# Patient Record
Sex: Female | Born: 1995 | Race: White | Hispanic: No | Marital: Single | Smoking: Current every day smoker
Health system: Southern US, Community
[De-identification: ages and names within clinical notes are randomized; demographics above are authoritative.]

## PROBLEM LIST (undated history)

## (undated) DIAGNOSIS — J45909 Unspecified asthma, uncomplicated: Secondary | ICD-10-CM

## (undated) HISTORY — DX: Unspecified asthma, uncomplicated: J45.909

---

## 2016-03-26 ENCOUNTER — Ambulatory Visit (INDEPENDENT_AMBULATORY_CARE_PROVIDER_SITE_OTHER): Payer: Medicaid Other

## 2016-03-26 ENCOUNTER — Encounter (HOSPITAL_COMMUNITY): Payer: Self-pay | Admitting: *Deleted

## 2016-03-26 ENCOUNTER — Ambulatory Visit (HOSPITAL_COMMUNITY)
Admission: EM | Admit: 2016-03-26 | Discharge: 2016-03-26 | Disposition: A | Discharge status: Home / Self Care | Payer: Medicaid Other | Attending: Family Medicine | Admitting: Family Medicine

## 2016-03-26 DIAGNOSIS — S93401A Sprain of unspecified ligament of right ankle, initial encounter: Secondary | ICD-10-CM

## 2016-03-26 NOTE — ED Provider Notes (Signed)
CSN: [id number]     Arrival date & time 03/26/16  1614 History   First MD Initiated Contact with Patient 03/26/16 1754     Chief Complaint  Patient presents with  . Ankle Injury   (Consider location/radiation/quality/duration/timing/severity/associated sxs/prior Treatment) 20 year old female states she twisted her right ankle 2 days ago and it is not better today even though she has been treating with Rice therapy.      History reviewed. No pertinent past medical history. History reviewed. No pertinent surgical history. History reviewed. No pertinent family history. Social History  Substance Use Topics  . Smoking status: Never Smoker  . Smokeless tobacco: Never Used  . Alcohol use No   OB History    No data available     Review of Systems  Constitutional: Negative for activity change, chills and fever.  HENT: Negative.   Respiratory: Negative.   Cardiovascular: Negative.   Musculoskeletal:       As per HPI  Skin: Negative for color change, pallor and rash.  Neurological: Negative.   All other systems reviewed and are negative.   Allergies  Review of patient's allergies indicates no known allergies.  Home Medications   Prior to Admission medications   Not on File   Meds Ordered and Administered this Visit  Medications - No data to display  BP 122/74   Pulse 71   Temp 98.7 F (37.1 C) (Oral)   Resp 16   LMP 01/24/2016 Comment: abstinate   and  bcp  SpO2 100%  No data found.   Physical Exam  Constitutional: She is oriented to person, place, and time. She appears well-developed and well-nourished. No distress.  HENT:  Head: Normocephalic and atraumatic.  Eyes: EOM are normal. Pupils are equal, round, and reactive to light.  Neck: Normal range of motion. Neck supple.  Musculoskeletal:  Mild swelling over the lateral malleolus, less over the medial malleolus. Tenderness to the soft tissue around the molar lying. Plantarflexion and dorsiflexion limited due  to ankle pain. No obvious deformity. Distal neurovascular and motor sensory is intact. Pedal pulse 2+.  Lymphadenopathy:    She has no cervical adenopathy.  Neurological: She is alert and oriented to person, place, and time. No cranial nerve deficit.  Skin: Skin is warm and dry.    Urgent Care Course   Clinical Course    Procedures (including critical care time)  Labs Review Labs Reviewed - No data to display  Imaging Review Dg Ankle Complete Right  Result Date: 03/26/2016 CLINICAL DATA:  Pain after fall 3 days ago EXAM: RIGHT ANKLE - COMPLETE 3+ VIEW COMPARISON:  None. FINDINGS: There is no evidence of fracture, dislocation, or joint effusion. There is no evidence of arthropathy or other focal bone abnormality. Soft tissues are unremarkable. IMPRESSION: Negative. Electronically Signed   By: Gerome Sam III M.D   On: 03/26/2016 18:43     Visual Acuity Review  Right Eye Distance:   Left Eye Distance:   Bilateral Distance:    Right Eye Near:   Left Eye Near:    Bilateral Near:         MDM   1. Right ankle sprain, initial encounter    Continue to apply ice to the ankle, keep elevated, rest as much as possible and decrease her walking for the next few days and keep it wrapped. It will take several days for your ankle to heal and for the swelling to go down. No running or sports for the next  10-12 days.     Hayden Rasmussenavid Faron Whitelock, NP 03/26/16 1905

## 2016-03-26 NOTE — ED Triage Notes (Signed)
Pt  Reports she  Twisted  Her r  Ankle   sev  Days  Ago  She has  Pain  r  Ankle   With   Pain on  Weight  Bearing     No  Obvious  Deformity

## 2016-03-26 NOTE — Discharge Instructions (Signed)
Continue to apply ice to the ankle, keep elevated, rest as much as possible and decrease her walking for the next few days and keep it wrapped. It will take several days. Her ankle to heal and for the swelling to go down. No running or sports for the next 10-12 days.

## 2018-03-18 ENCOUNTER — Other Ambulatory Visit: Payer: Self-pay

## 2018-03-18 ENCOUNTER — Encounter (HOSPITAL_COMMUNITY): Payer: Self-pay | Admitting: Emergency Medicine

## 2018-03-18 ENCOUNTER — Emergency Department (HOSPITAL_COMMUNITY): Payer: BLUE CROSS/BLUE SHIELD

## 2018-03-18 ENCOUNTER — Emergency Department (HOSPITAL_COMMUNITY)
Admission: EM | Admit: 2018-03-18 | Discharge: 2018-03-19 | Disposition: A | Discharge status: Home / Self Care | Payer: BLUE CROSS/BLUE SHIELD | Attending: Emergency Medicine | Admitting: Emergency Medicine

## 2018-03-18 DIAGNOSIS — S60051A Contusion of right little finger without damage to nail, initial encounter: Secondary | ICD-10-CM

## 2018-03-18 DIAGNOSIS — S80211A Abrasion, right knee, initial encounter: Secondary | ICD-10-CM | POA: Diagnosis not present

## 2018-03-18 DIAGNOSIS — Y929 Unspecified place or not applicable: Secondary | ICD-10-CM | POA: Diagnosis not present

## 2018-03-18 DIAGNOSIS — S6991XA Unspecified injury of right wrist, hand and finger(s), initial encounter: Secondary | ICD-10-CM | POA: Diagnosis present

## 2018-03-18 DIAGNOSIS — Y939 Activity, unspecified: Secondary | ICD-10-CM | POA: Diagnosis not present

## 2018-03-18 DIAGNOSIS — Y999 Unspecified external cause status: Secondary | ICD-10-CM | POA: Diagnosis not present

## 2018-03-18 DIAGNOSIS — T07XXXA Unspecified multiple injuries, initial encounter: Secondary | ICD-10-CM

## 2018-03-18 MED ORDER — IBUPROFEN 400 MG PO TABS
600.0000 mg | ORAL_TABLET | Freq: Once | ORAL | Status: AC
  Administered 2018-03-18: 600 mg via ORAL
  Filled 2018-03-18: qty 1

## 2018-03-18 MED ORDER — TETANUS-DIPHTH-ACELL PERTUSSIS 5-2.5-18.5 LF-MCG/0.5 IM SUSP
0.5000 mL | Freq: Once | INTRAMUSCULAR | Status: AC
  Administered 2018-03-18: 0.5 mL via INTRAMUSCULAR
  Filled 2018-03-18: qty 0.5
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
## 2018-03-19 ENCOUNTER — Emergency Department (HOSPITAL_COMMUNITY): Payer: BLUE CROSS/BLUE SHIELD

## 2018-03-19 LAB — I-STAT BETA HCG BLOOD, ED (MC, WL, AP ONLY)

## 2018-03-19 MED ORDER — METHOCARBAMOL 500 MG PO TABS: 500.0000 mg | ORAL_TABLET | Freq: Four times a day (QID) | ORAL | 0 refills | Status: AC | PRN

## 2018-03-19 MED ORDER — BACITRACIN ZINC 500 UNIT/GM EX OINT
TOPICAL_OINTMENT | Freq: Once | CUTANEOUS | Status: AC
  Administered 2018-03-19: 02:00:00 via TOPICAL

## 2018-03-19 MED ORDER — METHOCARBAMOL 500 MG PO TABS: 500.0000 mg | ORAL_TABLET | Freq: Four times a day (QID) | ORAL | 0 refills | Status: DC | PRN

## 2018-03-19 NOTE — Discharge Instructions (Addendum)
Buddy-tape your pinky finger as needed. If you continue to have problems with the finger, then follow up with the hand specialist.

## 2020-05-17 ENCOUNTER — Ambulatory Visit (HOSPITAL_COMMUNITY)
Admission: EM | Admit: 2020-05-17 | Discharge: 2020-05-17 | Disposition: A | Discharge status: Home / Self Care | Payer: Medicaid Other | Attending: Emergency Medicine | Admitting: Emergency Medicine

## 2020-05-17 ENCOUNTER — Other Ambulatory Visit: Payer: Self-pay

## 2020-05-17 ENCOUNTER — Ambulatory Visit (INDEPENDENT_AMBULATORY_CARE_PROVIDER_SITE_OTHER): Payer: Medicaid Other

## 2020-05-17 ENCOUNTER — Encounter (HOSPITAL_COMMUNITY): Payer: Self-pay

## 2020-05-17 DIAGNOSIS — S62356A Nondisplaced fracture of shaft of fifth metacarpal bone, right hand, initial encounter for closed fracture: Secondary | ICD-10-CM

## 2020-05-17 DIAGNOSIS — S60221A Contusion of right hand, initial encounter: Secondary | ICD-10-CM

## 2020-05-17 DIAGNOSIS — W228XXA Striking against or struck by other objects, initial encounter: Secondary | ICD-10-CM

## 2020-05-17 DIAGNOSIS — M79641 Pain in right hand: Secondary | ICD-10-CM

## 2020-05-17 MED ORDER — IBUPROFEN 800 MG PO TABS
800.0000 mg | ORAL_TABLET | Freq: Three times a day (TID) | ORAL | 0 refills | Status: AC | PRN
Start: 2020-05-17 — End: ?
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
## 2020-05-17 NOTE — ED Notes (Signed)
Splint applied by ortho tech

## 2020-05-17 NOTE — ED Notes (Signed)
Called ortho tech, Kerry Cannon, for ulnar gutter placement. ETA approx 15 minutes after ortho tech goes to ED.
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
## 2020-05-17 NOTE — Discharge Instructions (Signed)
Take the ibuprofen as prescribed.  Rest and elevate your hand.  Apply ice packs 2-3 times a day for up to 20 minutes each.  Wear the splint.    Schedule a follow-up appointment with an orthopedist such as the one listed below as soon as possible.

## 2020-05-17 NOTE — Progress Notes (Signed)
Orthopedic Tech Progress Note Patient Details:  Kerry Cannon [birth year] [id number]  Ortho Devices Type of Ortho Device: Ulna gutter splint Ortho Device/Splint Location: RUE Ortho Device/Splint Interventions: Application   Post Interventions Patient Tolerated: Well Instructions Provided: Care of device   Jeramine Delis E Emmet Messer 05/17/2020, 7:06 PM

## 2020-08-08 ENCOUNTER — Encounter (HOSPITAL_COMMUNITY): Payer: Self-pay

## 2020-08-08 ENCOUNTER — Telehealth (INDEPENDENT_AMBULATORY_CARE_PROVIDER_SITE_OTHER): Payer: No Payment, Other | Admitting: Psychiatry

## 2020-08-08 ENCOUNTER — Other Ambulatory Visit: Payer: Self-pay

## 2020-08-08 DIAGNOSIS — F411 Generalized anxiety disorder: Secondary | ICD-10-CM | POA: Diagnosis not present

## 2020-08-08 DIAGNOSIS — F4312 Post-traumatic stress disorder, chronic: Secondary | ICD-10-CM | POA: Diagnosis not present

## 2020-08-08 DIAGNOSIS — F3132 Bipolar disorder, current episode depressed, moderate: Secondary | ICD-10-CM | POA: Diagnosis not present

## 2020-08-08 MED ORDER — LAMOTRIGINE 25 MG PO TABS: 25.0000 mg | ORAL_TABLET | Freq: Every day | ORAL | 1 refills | Status: DC
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
## 2020-09-02 ENCOUNTER — Telehealth (HOSPITAL_COMMUNITY): Payer: Self-pay

## 2020-09-02 NOTE — Telephone Encounter (Signed)
Patient called and stated that her Lamotrigine is giving her suicidal ideations that started about a week into starting it. She stated that she had never experienced anything like that before. She also stated that she has stopped taking it when the SI started. Please review and advise. Thank you

## 2020-09-05 ENCOUNTER — Other Ambulatory Visit: Payer: Self-pay

## 2020-09-05 ENCOUNTER — Telehealth (INDEPENDENT_AMBULATORY_CARE_PROVIDER_SITE_OTHER): Payer: No Payment, Other | Admitting: Psychiatry

## 2020-09-05 ENCOUNTER — Encounter (HOSPITAL_COMMUNITY): Payer: Self-pay

## 2020-09-05 DIAGNOSIS — F3131 Bipolar disorder, current episode depressed, mild: Secondary | ICD-10-CM | POA: Diagnosis not present

## 2020-09-05 MED ORDER — HYDROXYZINE HCL 10 MG PO TABS: 10.0000 mg | ORAL_TABLET | Freq: Three times a day (TID) | ORAL | 1 refills | Status: DC | PRN

## 2020-09-05 MED ORDER — OXCARBAZEPINE 300 MG PO TABS: 150.0000 mg | ORAL_TABLET | Freq: Two times a day (BID) | ORAL | 1 refills | Status: DC
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
## 2020-09-12 ENCOUNTER — Encounter (HOSPITAL_COMMUNITY): Payer: Self-pay

## 2020-09-12 ENCOUNTER — Other Ambulatory Visit: Payer: Self-pay

## 2020-09-12 ENCOUNTER — Telehealth (INDEPENDENT_AMBULATORY_CARE_PROVIDER_SITE_OTHER): Payer: No Payment, Other | Admitting: Psychiatry

## 2020-09-12 DIAGNOSIS — F3132 Bipolar disorder, current episode depressed, moderate: Secondary | ICD-10-CM

## 2020-09-12 NOTE — Progress Notes (Signed)
Psychiatric Initial Adult Assessment   Patient Identification: Kerry Cannon MRN:  [id number] Date of Evaluation:  09/12/2020 Referral Source: self Chief Complaint:  Depression and anxiety  Visit Diagnosis:  Bipolar affective disorder, depression, mild  History of Present Illness:   25 yo female presents for assistance with depression and anxiety follow up after starting new medications, Tripleptal.  Based on her work schedule, started the Trileptal 3 days ago, no negative side effects.  "Doing fine, anxiety is much better." Changing jobs has helped significantly, excited about her new job at a [employer].  Sleep is "amazing" with hydroxyzine.  Appetite is steady.  No complaints.  Educated her to call if she has any negative reactions like she did with Lamictal, restarted in February and experienced suicidal ideations 8 days later.    Follow-up in one month, sooner if needed advised to stop taking Trileptal if suicidal ideations started.  Associated Signs/Symptoms: Depression Symptoms:  Depression, low (Hypo) Manic Symptoms:  Irritable Mood, Labiality of Mood, in the past, none currently Anxiety Symptoms:  Excessive worries in the past, improved significantly Psychotic Symptoms:  none PTSD Symptoms: Had a traumatic exposure:  complex childhood trauma  Past Psychiatric History: bipolar II, depression, anxiety, PTSD  Previous Psychotropic Medications: Yes   Substance Abuse History in the last 12 months:  No.  Consequences of Substance Abuse: NA  Past Medical History: No past medical history on file. No past surgical history on file.  Family Psychiatric History: bipolar and depression on both sides of the family  Family History: No family history on file.  Social History:   Social History   Socioeconomic History  . Marital status: Single    Spouse name: Not on file  . Number of children: Not on file  . Years of education: Not on file  . Highest education  level: Not on file  Occupational History  . Not on file  Tobacco Use  . Smoking status: Current Every Day Smoker    Types: E-cigarettes  . Smokeless tobacco: Current User  . Tobacco comment: vapes nicotine  Vaping Use  . Vaping Use: Every day  Substance and Sexual Activity  . Alcohol use: No  . Drug use: Never  . Sexual activity: Not on file  Other Topics Concern  . Not on file  Social History Narrative  . Not on file   Social Determinants of Health   Financial Resource Strain: Not on file  Food Insecurity: Not on file  Transportation Needs: Not on file  Physical Activity: Not on file  Stress: Not on file  Social Connections: Not on file    Additional Social History: lives with two roommates and workss  Allergies:  No Known Allergies  Metabolic Disorder Labs: No results found for: HGBA1C, MPG No results found for: PROLACTIN No results found for: CHOL, TRIG, HDL, CHOLHDL, VLDL, LDLCALC No results found for: TSH  Therapeutic Level Labs: No results found for: LITHIUM No results found for: CBMZ No results found for: VALPROATE  Current Medications: Current Outpatient Medications  Medication Sig Dispense Refill  . hydrOXYzine (ATARAX/VISTARIL) 10 MG tablet Take 1 tablet (10 mg total) by mouth 3 (three) times daily as needed for anxiety (may take 2-3 for sleep PRN). 180 tablet 1  . ibuprofen (ADVIL) 800 MG tablet Take 1 tablet (800 mg total) by mouth every 8 (eight) hours as needed. 21 tablet 0  . methocarbamol (ROBAXIN) 500 MG tablet Take 1 tablet (500 mg total) by mouth every 6 (  six) hours as needed for muscle spasms. 40 tablet 0  . Oxcarbazepine (TRILEPTAL) 300 MG tablet Take 0.5 tablets (150 mg total) by mouth 2 (two) times daily. 60 tablet 1   No current facility-administered medications for this visit.    Musculoskeletal: Strength & Muscle Tone: did not witness, telephone interview Gait & Station: did not witness Patient leans: did not witness  Psychiatric  Specialty Exam: Review of Systems  Psychiatric/Behavioral: Positive for dysphoric mood. The patient is nervous/anxious.   All other systems reviewed and are negative.   There were no vitals taken for this visit.There is no height or weight on file to calculate BMI.  General Appearance: UTA, telephone assessment  Eye Contact:  UTS  Speech:  Normal Rate  Volume:  Normal  Mood:  Anxious and Depressed, low levels  Affect:  UTA  Thought Process:  Coherent  Orientation:  Full (Time, Place, and Person)  Thought Content:  WDL and Logical  Suicidal Thoughts:  No  Homicidal Thoughts:  No  Memory:  Immediate;   Good Recent;   Good Remote;   Good  Judgement:  Good  Insight:  Good  Psychomotor Activity:  Normal  Concentration:  Concentration: Fair and Attention Span: Fair  Recall:  Good  Fund of Knowledge:Good  Language: Good  Akathisia:  No  Handed:  Right  AIMS (if indicated):  not done  Assets:  Housing Leisure Time Physical Health Resilience Social Support Vocational/Educational  ADL's:  Intact  Cognition: WNL  Sleep:  Fair     Assessment and Plan:  Bipolar affective disorder, depressed, moderate: -Continue Trileptal 150 mg BID -Follow up in 1 month -Continue therapy  PTSD, anxiety: -Continue hydroxyzine 10 mg TID PRN -Continue therapy  Insomnia: -Continue hydroxyzine 20-30 mg at bedtime PRN  Virtual Visit via Telephone Note  I connected with Lennie Muckle on 09/12/20 at 12:00 PM EST by telephone and verified that I am speaking with the correct person using two identifiers.  She prefers telephone, not video.  Location: Patient: home Provider: [facility]   I discussed the limitations, risks, security and privacy concerns of performing an evaluation and management service by telephone and the availability of in person appointments. I also discussed with the patient that there may be a patient responsible charge related to this service. The patient expressed  understanding and agreed to proceed.  Follow Up Instructions: Follow up in 1 month  I discussed the assessment and treatment plan with the patient. The patient was provided an opportunity to ask questions and all were answered. The patient agreed with the plan and demonstrated an understanding of the instructions.   The patient was advised to call back or seek an in-person evaluation if the symptoms worsen or if the condition fails to improve as anticipated.  I provided 10 minutes of non-face-to-face time during this encounter.   Nanine Means, NP    Nanine Means, NP 2/21/202211:52 AM

## 2020-10-10 ENCOUNTER — Other Ambulatory Visit: Payer: Self-pay

## 2020-10-10 ENCOUNTER — Telehealth (INDEPENDENT_AMBULATORY_CARE_PROVIDER_SITE_OTHER): Payer: No Payment, Other | Admitting: Psychiatry

## 2020-10-10 ENCOUNTER — Encounter (HOSPITAL_COMMUNITY): Payer: Self-pay

## 2020-10-10 DIAGNOSIS — F3131 Bipolar disorder, current episode depressed, mild: Secondary | ICD-10-CM | POA: Diagnosis not present

## 2020-10-10 MED ORDER — HYDROXYZINE HCL 10 MG PO TABS: 10.0000 mg | ORAL_TABLET | Freq: Three times a day (TID) | ORAL | 1 refills | Status: DC | PRN

## 2020-10-10 MED ORDER — OXCARBAZEPINE 300 MG PO TABS: 150.0000 mg | ORAL_TABLET | Freq: Two times a day (BID) | ORAL | 3 refills | Status: DC

## 2020-10-10 NOTE — Progress Notes (Signed)
Psychiatric Initial Adult Assessment   Patient Identification: Kerry Cannon MRN:  [id number] Date of Evaluation:  10/10/2020 Referral Source: self Chief Complaint: Anxiety  Visit Diagnosis:  Bipolar affective disorder, depression, mild  History of Present Illness:   25 yo female presents for assistance with depression and anxiety follow up after starting new medications, Trileptal and hydroxyzine.  Client reports minimal anxiety and no depression or side effects from medications.  "I have been suffering for so long, and I cannot believe how much better I feel."  No mania symptoms, hallucinations, suicidal/homicidal ideations, panic attacks, or substance abuse.  She recently changed jobs which also helped her anxiety.  No issues at this time and will follow up in 3 months or sooner if needed.  Associated Signs/Symptoms: Depression Symptoms:  Depression, low (Hypo) Manic Symptoms:  Irritable Mood, Labiality of Mood, in the past, none currently Anxiety Symptoms:  Excessive worries in the past, improved significantly Psychotic Symptoms:  none PTSD Symptoms: Had a traumatic exposure:  complex childhood trauma  Past Psychiatric History: bipolar II, depression, anxiety, PTSD  Previous Psychotropic Medications: Yes   Substance Abuse History in the last 12 months:  No.  Consequences of Substance Abuse: NA  Past Medical History: No past medical history on file. No past surgical history on file.  Family Psychiatric History: bipolar and depression on both sides of the family  Family History: No family history on file.  Social History:   Social History   Socioeconomic History  . Marital status: Single    Spouse name: Not on file  . Number of children: Not on file  . Years of education: Not on file  . Highest education level: Not on file  Occupational History  . Not on file  Tobacco Use  . Smoking status: Current Every Day Smoker    Types: E-cigarettes  . Smokeless tobacco:  Current User  . Tobacco comment: vapes nicotine  Vaping Use  . Vaping Use: Every day  Substance and Sexual Activity  . Alcohol use: No  . Drug use: Never  . Sexual activity: Not on file  Other Topics Concern  . Not on file  Social History Narrative  . Not on file   Social Determinants of Health   Financial Resource Strain: Not on file  Food Insecurity: Not on file  Transportation Needs: Not on file  Physical Activity: Not on file  Stress: Not on file  Social Connections: Not on file    Additional Social History: lives with two roommates and workss  Allergies:  No Known Allergies  Metabolic Disorder Labs: No results found for: HGBA1C, MPG No results found for: PROLACTIN No results found for: CHOL, TRIG, HDL, CHOLHDL, VLDL, LDLCALC No results found for: TSH  Therapeutic Level Labs: No results found for: LITHIUM No results found for: CBMZ No results found for: VALPROATE  Current Medications: Current Outpatient Medications  Medication Sig Dispense Refill  . hydrOXYzine (ATARAX/VISTARIL) 10 MG tablet Take 1 tablet (10 mg total) by mouth 3 (three) times daily as needed for anxiety (may take 2-3 for sleep PRN). 180 tablet 1  . ibuprofen (ADVIL) 800 MG tablet Take 1 tablet (800 mg total) by mouth every 8 (eight) hours as needed. 21 tablet 0  . methocarbamol (ROBAXIN) 500 MG tablet Take 1 tablet (500 mg total) by mouth every 6 (six) hours as needed for muscle spasms. 40 tablet 0  . Oxcarbazepine (TRILEPTAL) 300 MG tablet Take 0.5 tablets (150 mg total) by mouth 2 (two) times daily.  60 tablet 1   No current facility-administered medications for this visit.    Musculoskeletal: Strength & Muscle Tone: did not witness, telephone interview Gait & Station: did not witness Patient leans: did not witness  Psychiatric Specialty Exam: Review of Systems  Psychiatric/Behavioral: The patient is nervous/anxious.   All other systems reviewed and are negative.   There were no vitals  taken for this visit.There is no height or weight on file to calculate BMI.  General Appearance: UTA, telephone assessment  Eye Contact:  UTS  Speech:  Normal Rate  Volume:  Normal  Mood: Anxiety, mild  Affect:  UTA  Thought Process:  Coherent  Orientation:  Full (Time, Place, and Person)  Thought Content:  WDL and Logical  Suicidal Thoughts:  No  Homicidal Thoughts:  No  Memory:  Immediate;   Good Recent;   Good Remote;   Good  Judgement:  Good  Insight:  Good  Psychomotor Activity:  Normal  Concentration:  Concentration: Fair and Attention Span: Fair  Recall:  Good  Fund of Knowledge:Good  Language: Good  Akathisia:  No  Handed:  Right  AIMS (if indicated):  not done  Assets:  Housing Leisure Time Physical Health Resilience Social Support Vocational/Educational  ADL's:  Intact  Cognition: WNL  Sleep:  Fair    PHQ2-9   Flowsheet Row Video Visit from 09/12/2020 in [facility]  PHQ-2 Total Score 0     Assessment and Plan:  Bipolar affective disorder, depressed, moderate: -Continue Trileptal 150 mg BID -Follow up in 3 months -Continue therapy  PTSD, anxiety: -Continue hydroxyzine 10 mg TID PRN -Continue therapy  Insomnia: -Continue hydroxyzine 20-30 mg at bedtime PRN  Virtual Visit via Telephone Note  I connected with Kerry Cannon on 10/10/20 at  1:30 PM EDT by telephone and verified that I am speaking with the correct person using two identifiers.  She prefers telephone, not video.  Location: Patient: home Provider: [facility]   I discussed the limitations, risks, security and privacy concerns of performing an evaluation and management service by telephone and the availability of in person appointments. I also discussed with the patient that there may be a patient responsible charge related to this service. The patient expressed understanding and agreed to proceed.  Follow Up Instructions: Follow up in 3 months  I discussed  the assessment and treatment plan with the patient. The patient was provided an opportunity to ask questions and all were answered. The patient agreed with the plan and demonstrated an understanding of the instructions.   The patient was advised to call back or seek an in-person evaluation if the symptoms worsen or if the condition fails to improve as anticipated.  I provided 20 minutes of non-face-to-face time during this encounter.   Nanine Means, NP    Nanine Means, NP 3/21/20221:33 PM

## 2021-01-02 ENCOUNTER — Encounter (HOSPITAL_COMMUNITY): Payer: Self-pay

## 2021-01-02 ENCOUNTER — Emergency Department (HOSPITAL_COMMUNITY)
Admission: EM | Admit: 2021-01-02 | Discharge: 2021-01-03 | Disposition: A | Discharge status: Home / Self Care | Payer: Medicaid Other | Attending: Emergency Medicine | Admitting: Emergency Medicine

## 2021-01-02 ENCOUNTER — Other Ambulatory Visit: Payer: Self-pay

## 2021-01-02 DIAGNOSIS — Z20822 Contact with and (suspected) exposure to covid-19: Secondary | ICD-10-CM | POA: Insufficient documentation

## 2021-01-02 DIAGNOSIS — F17290 Nicotine dependence, other tobacco product, uncomplicated: Secondary | ICD-10-CM | POA: Insufficient documentation

## 2021-01-02 DIAGNOSIS — J45909 Unspecified asthma, uncomplicated: Secondary | ICD-10-CM | POA: Insufficient documentation

## 2021-01-02 DIAGNOSIS — J029 Acute pharyngitis, unspecified: Secondary | ICD-10-CM | POA: Insufficient documentation

## 2021-01-02 LAB — GROUP A STREP BY PCR: Group A Strep by PCR: NOT DETECTED

## 2021-01-02 MED ORDER — CLINDAMYCIN HCL 300 MG PO CAPS
300.0000 mg | ORAL_CAPSULE | Freq: Once | ORAL | Status: AC
Start: 2021-01-02 — End: 2021-01-02
  Administered 2021-01-02: 300 mg via ORAL
  Filled 2021-01-02: qty 1

## 2021-01-02 MED ORDER — DEXAMETHASONE SODIUM PHOSPHATE 10 MG/ML IJ SOLN
10.0000 mg | Freq: Once | INTRAMUSCULAR | Status: AC
  Administered 2021-01-02: 10 mg via INTRAMUSCULAR
  Filled 2021-01-02: qty 1

## 2021-01-02 MED ORDER — CLINDAMYCIN HCL 300 MG PO CAPS: 300.0000 mg | ORAL_CAPSULE | Freq: Four times a day (QID) | ORAL | 0 refills | Status: AC

## 2021-01-02 NOTE — ED Provider Notes (Signed)
[facility]   CSN: [id number] Arrival date & time: 01/02/21  1625     History Chief Complaint  Patient presents with   Sore Throat    Kerry Cannon is a 25 y.o. female.  Patient is a 25 year old female who presents with a sore throat.  She has had a history of recurrent strep throat and recurrent tonsil stones.  She states that about a month ago she had a tonsil stone in her left tonsil.  About a week and a half after she noticed it, she pulled it out.  Following that, it got more painful and swollen.  She has had some ongoing swelling to her left tonsil.  It hurts to swallow.  She does not have trouble controlling her saliva.  No difficulty breathing.  She says she has some mild shortness of breath but feels it may be related to her asthma.  No known fevers.  She had a temperature of 99 here in the ED.  No vomiting.  No other URI symptoms.      Past Medical History:  Diagnosis Date   Asthma     Patient Active Problem List   Diagnosis Date Noted   Bipolar affective disorder, depressed, mild (HCC) 09/05/2020   Chronic post-traumatic stress disorder (PTSD) 08/08/2020   Bipolar affective disorder, depressed, moderate (HCC) 08/08/2020   Anxiety state 08/08/2020    History reviewed. No pertinent surgical history.   OB History   No obstetric history on file.     History reviewed. No pertinent family history.  Social History   Tobacco Use   Smoking status: Every Day    Pack years: 0.00    Types: E-cigarettes   Smokeless tobacco: Current   Tobacco comments:    vapes nicotine  Vaping Use   Vaping Use: Every day  Substance Use Topics   Alcohol use: No   Drug use: Never    Home Medications Prior to Admission medications   Medication Sig Start Date End Date Taking? Authorizing Provider  clindamycin (CLEOCIN) 300 MG capsule Take 1 capsule (300 mg total) by mouth 4 (four) times daily. X 7 days 01/02/21  Yes Rolan Bucco, MD  hydrOXYzine (ATARAX/VISTARIL) 10 MG tablet Take 1 tablet (10 mg total) by mouth 3 (three) times daily as needed for anxiety (may take 2-3 for sleep PRN). 10/10/20   Charm Rings, NP  ibuprofen (ADVIL) 800 MG tablet Take 1 tablet (800 mg total) by mouth every 8 (eight) hours as needed. 05/17/20   Mickie Bail, NP  methocarbamol (ROBAXIN) 500 MG tablet Take 1 tablet (500 mg total) by mouth every 6 (six) hours as needed for muscle spasms. 03/19/18   Dione Booze, MD  Oxcarbazepine (TRILEPTAL) 300 MG tablet Take 0.5 tablets (150 mg total) by mouth 2 (two) times daily. 10/10/20   Charm Rings, NP    Allergies    Patient has no known allergies.  Review of Systems   Review of Systems  Constitutional:  Negative for chills, diaphoresis, fatigue and fever.  HENT:  Positive for sore throat. Negative for congestion, rhinorrhea and sneezing.   Eyes: Negative.   Respiratory:  Positive for shortness of breath. Negative for cough and chest tightness.   Cardiovascular:  Negative for chest pain and leg swelling.  Gastrointestinal:  Negative for abdominal pain, blood in stool, diarrhea, nausea and vomiting.  Genitourinary:  Negative for difficulty urinating, flank pain, frequency and hematuria.  Musculoskeletal:  Negative for arthralgias  and back pain.  Skin:  Negative for rash.  Neurological:  Negative for dizziness, speech difficulty, weakness, numbness and headaches.   Physical Exam Updated Vital Signs BP 124/79 (BP Location: Right Arm)   Pulse 63   Temp 99 F (37.2 C) (Oral)   Resp 16   SpO2 95%   Physical Exam Constitutional:      Appearance: She is well-developed.  HENT:     Head: Normocephalic and atraumatic.     Mouth/Throat:     Comments: Mild swelling of the left tonsil with small exudates.  There is no fullness of the peritonsillar area.  Uvula is midline.  No trismus.  No significant lymphadenopathy. Eyes:     Pupils: Pupils are equal, round, and reactive to light.   Cardiovascular:     Rate and Rhythm: Normal rate and regular rhythm.     Heart sounds: Normal heart sounds.  Pulmonary:     Effort: Pulmonary effort is normal. No respiratory distress.     Breath sounds: Normal breath sounds. No wheezing or rales.  Chest:     Chest wall: No tenderness.  Abdominal:     General: Bowel sounds are normal.     Palpations: Abdomen is soft.     Tenderness: There is no abdominal tenderness. There is no guarding or rebound.  Musculoskeletal:        General: Normal range of motion.     Cervical back: Normal range of motion and neck supple.  Lymphadenopathy:     Cervical: No cervical adenopathy.  Skin:    General: Skin is warm and dry.     Findings: No rash.  Neurological:     Mental Status: She is alert and oriented to person, place, and time.    ED Results / Procedures / Treatments   Labs (all labs ordered are listed, but only abnormal results are displayed) Labs Reviewed  GROUP A STREP BY PCR  SARS CORONAVIRUS 2 (TAT 6-24 HRS)    EKG None  Radiology No results found.  Procedures Procedures   Medications Ordered in ED Medications  dexamethasone (DECADRON) injection 10 mg (10 mg Intramuscular Given 01/02/21 2338)  clindamycin (CLEOCIN) capsule 300 mg (300 mg Oral Given 01/02/21 2338)    ED Course  I have reviewed the triage vital signs and the nursing notes.  Pertinent labs & imaging results that were available during my care of the patient were reviewed by me and considered in my medical decision making (see chart for details).    MDM Rules/Calculators/A&P                          Patient is a 25 year old who presents with a sore throat.  She has some mild swelling to the left tonsil but it does not look concerning for peritonsillar abscess.  She has no airway compromise.  No difficulty controlling her secretions.  No other concerns for deep tissue infections.  She was started on clindamycin and was given a dose of Decadron in the ED.   She was given a referral to follow-up with ENT if her symptoms are improving.  Return precautions were given. Final Clinical Impression(s) / ED Diagnoses Final diagnoses:  Pharyngitis, unspecified etiology    Rx / DC Orders ED Discharge Orders          Ordered    clindamycin (CLEOCIN) 300 MG capsule  4 times daily        01/02/21 2343  Rolan Bucco, MD 01/02/21 2348

## 2021-01-02 NOTE — Discharge Instructions (Addendum)
Take the antibiotics as discussed.  Follow-up with the ear nose and throat doctor if your symptoms are not improving.  Return here as needed if you have any worsening symptoms or increased throat swelling.

## 2021-01-02 NOTE — ED Triage Notes (Signed)
Pt c/o throat pain on left side. Pt states she had a tonsil stone that she pulled out approx May 30. Since then pt has had pain on left side of throat. Pt states her throat feels slightly swollen. Pt c/o white spots to back of throat.

## 2021-01-03 LAB — SARS CORONAVIRUS 2 (TAT 6-24 HRS): SARS Coronavirus 2: NEGATIVE

## 2021-01-09 ENCOUNTER — Other Ambulatory Visit: Payer: Self-pay

## 2021-01-09 ENCOUNTER — Encounter (HOSPITAL_COMMUNITY): Payer: No Payment, Other | Admitting: Psychiatry

## 2021-02-13 ENCOUNTER — Other Ambulatory Visit: Payer: Self-pay

## 2021-02-13 ENCOUNTER — Encounter (HOSPITAL_COMMUNITY): Payer: Self-pay

## 2021-02-13 ENCOUNTER — Telehealth (INDEPENDENT_AMBULATORY_CARE_PROVIDER_SITE_OTHER): Payer: No Payment, Other | Admitting: Psychiatry

## 2021-02-13 DIAGNOSIS — F3131 Bipolar disorder, current episode depressed, mild: Secondary | ICD-10-CM | POA: Diagnosis not present

## 2021-02-13 MED ORDER — OXCARBAZEPINE 300 MG PO TABS: 150.0000 mg | ORAL_TABLET | Freq: Two times a day (BID) | ORAL | 3 refills | Status: AC

## 2021-02-13 MED ORDER — HYDROXYZINE HCL 10 MG PO TABS: 10.0000 mg | ORAL_TABLET | Freq: Three times a day (TID) | ORAL | 1 refills | Status: AC | PRN

## 2021-02-13 NOTE — Progress Notes (Signed)
Psychiatric Initial Adult Assessment   Patient Identification: Kerry Cannon MRN:  [id number] Date of Evaluation:  02/13/2021 Referral Source: self Chief Complaint: Anxiety Chief Complaint   Anxiety; Medication Refill; Follow-up; Depression    Visit Diagnosis:  Bipolar affective disorder, depression, mild  History of Present Illness:   25 yo female presents for assistance with depression and anxiety follow up after starting new medications, Trileptal and hydroxyzine are "working very well", no side effects from her medications. Client reports minimal anxiety and no depression or side effects from medications.  Some recent stressors and worked with her therapist on boundaries effectively.  She will be moving to the [place] area and will establish care there in the future.  Rx provided to prevent lapse of care with instructions to call the office for any concerns or information for resources in her new area.  No mania symptoms, hallucinations, suicidal/homicidal ideations, panic attacks, or substance abuse.  No issues at this time and will follow up in 3 months or sooner if needed or until she moves.    Associated Signs/Symptoms: Depression Symptoms:  Depression, low (Hypo) Manic Symptoms:  Irritable Mood, Labiality of Mood, in the past, none currently Anxiety Symptoms:  Excessive worries in the past, improved significantly Psychotic Symptoms:   none PTSD Symptoms: Had a traumatic exposure:  complex childhood trauma  Past Psychiatric History: bipolar II, depression, anxiety, PTSD  Previous Psychotropic Medications: Yes   Substance Abuse History in the last 12 months:  No.  Consequences of Substance Abuse: NA  Past Medical History:  Past Medical History:  Diagnosis Date   Asthma    No past surgical history on file.  Family Psychiatric History: bipolar and depression on both sides of the family  Family History: No family history on file.  Social History:   Social History    Socioeconomic History   Marital status: Single    Spouse name: Not on file   Number of children: Not on file   Years of education: Not on file   Highest education level: Not on file  Occupational History   Not on file  Tobacco Use   Smoking status: Every Day    Types: E-cigarettes   Smokeless tobacco: Current   Tobacco comments:    vapes nicotine  Vaping Use   Vaping Use: Every day  Substance and Sexual Activity   Alcohol use: No   Drug use: Never   Sexual activity: Not on file  Other Topics Concern   Not on file  Social History Narrative   Not on file   Social Determinants of Health   Financial Resource Strain: Not on file  Food Insecurity: Not on file  Transportation Needs: Not on file  Physical Activity: Not on file  Stress: Not on file  Social Connections: Not on file    Additional Social History: lives with two roommates and workss  Allergies:  No Known Allergies  Metabolic Disorder Labs: No results found for: HGBA1C, MPG No results found for: PROLACTIN No results found for: CHOL, TRIG, HDL, CHOLHDL, VLDL, LDLCALC No results found for: TSH  Therapeutic Level Labs: No results found for: LITHIUM No results found for: CBMZ No results found for: VALPROATE  Current Medications: Current Outpatient Medications  Medication Sig Dispense Refill   clindamycin (CLEOCIN) 300 MG capsule Take 1 capsule (300 mg total) by mouth 4 (four) times daily. X 7 days 28 capsule 0   hydrOXYzine (ATARAX/VISTARIL) 10 MG tablet Take 1 tablet (10 mg total) by mouth 3 (  three) times daily as needed for anxiety (may take 2-3 for sleep PRN). 180 tablet 1   ibuprofen (ADVIL) 800 MG tablet Take 1 tablet (800 mg total) by mouth every 8 (eight) hours as needed. 21 tablet 0   methocarbamol (ROBAXIN) 500 MG tablet Take 1 tablet (500 mg total) by mouth every 6 (six) hours as needed for muscle spasms. 40 tablet 0   Oxcarbazepine (TRILEPTAL) 300 MG tablet Take 0.5 tablets (150 mg total) by  mouth 2 (two) times daily. 60 tablet 3   No current facility-administered medications for this visit.    Musculoskeletal: Strength & Muscle Tone:  did not witness, telephone interview Gait & Station:  did not witness Patient leans:  did not witness  Psychiatric Specialty Exam: Review of Systems  Psychiatric/Behavioral:  The patient is nervous/anxious.   All other systems reviewed and are negative.  There were no vitals taken for this visit.There is no height or weight on file to calculate BMI.  General Appearance:  UTA, telephone assessment  Eye Contact:   UTS  Speech:  Normal Rate  Volume:  Normal  Mood: Anxiety, mild  Affect:   UTA  Thought Process:  Coherent  Orientation:  Full (Time, Place, and Person)  Thought Content:  WDL and Logical  Suicidal Thoughts:  No  Homicidal Thoughts:  No  Memory:  Immediate;   Good Recent;   Good Remote;   Good  Judgement:  Good  Insight:  Good  Psychomotor Activity:  Normal  Concentration:  Concentration: Fair and Attention Span: Fair  Recall:  Good  Fund of Knowledge:Good  Language: Good  Akathisia:  No  Handed:  Right  AIMS (if indicated):  not done  Assets:  Housing Leisure Time Physical Health Resilience Social Support Vocational/Educational  ADL's:  Intact  Cognition: WNL  Sleep:  Fair    PHQ2-9    Flowsheet Row Video Visit from 09/12/2020 in [facility]  PHQ-2 Total Score 0      Flowsheet Row ED [facility]  C-SSRS RISK CATEGORY No Risk      Assessment and Plan:  Bipolar affective disorder, depressed, moderate: -Continue Trileptal 150 mg BID -Follow up in 3 months -Continue therapy  PTSD, anxiety: -Continue hydroxyzine 10 mg TID PRN -Continue therapy  Insomnia: -Continue hydroxyzine 20-30 mg at bedtime PRN  Virtual Visit via Telephone Note  I connected with Lennie Muckle on 02/13/21 at  8:30 AM EDT by telephone and  verified that I am speaking with the correct person using two identifiers.  She prefers telephone, not video.  Location: Patient: home Provider: Delfin Gant   I discussed the limitations, risks, security and privacy concerns of performing an evaluation and management service by telephone and the availability of in person appointments. I also discussed with the patient that there may be a patient responsible charge related to this service. The patient expressed understanding and agreed to proceed.  Follow Up Instructions: Follow up in 3 months  I discussed the assessment and treatment plan with the patient. The patient was provided an opportunity to ask questions and all were answered. The patient agreed with the plan and demonstrated an understanding of the instructions.   The patient was advised to call back or seek an in-person evaluation if the symptoms worsen or if the condition fails to improve as anticipated.  I provided 20 minutes of non-face-to-face time during this encounter.   Nanine Means, NP  Nanine Means, NP 7/25/20228:37 AM

## 2021-03-24 ENCOUNTER — Telehealth (HOSPITAL_COMMUNITY): Payer: Self-pay | Admitting: *Deleted

## 2021-03-24 NOTE — Telephone Encounter (Signed)
Call from patient seeking her medicine and she has not been able to get in touch with the pharmacy her medicine is at, she believed it was a mail order pharmacy. Reveiwed record, meds sent to [facility] in July when she had her last appt with Catha Nottingham. Patient is now living in [facility] and will need to made other arrangements for her mental health care which she says she intends to do. Instructed her to call her new pharmacy and ask them to contact [place] and have her two rxs moved. Provided her with the number to [place], which is not a [facility].

## 2021-05-08 NOTE — Progress Notes (Signed)
This encounter was created in error - please disregard.

## 2021-05-15 ENCOUNTER — Telehealth (HOSPITAL_COMMUNITY): Payer: No Payment, Other

## 2021-08-03 IMAGING — DX DG HAND COMPLETE 3+V*R*
3 series · 3 of 3 positions shown · non-contrast
Comparison: None.

CLINICAL DATA: Bruising and pain

EXAM:
RIGHT HAND - COMPLETE 3+ VIEW

[hand pa]
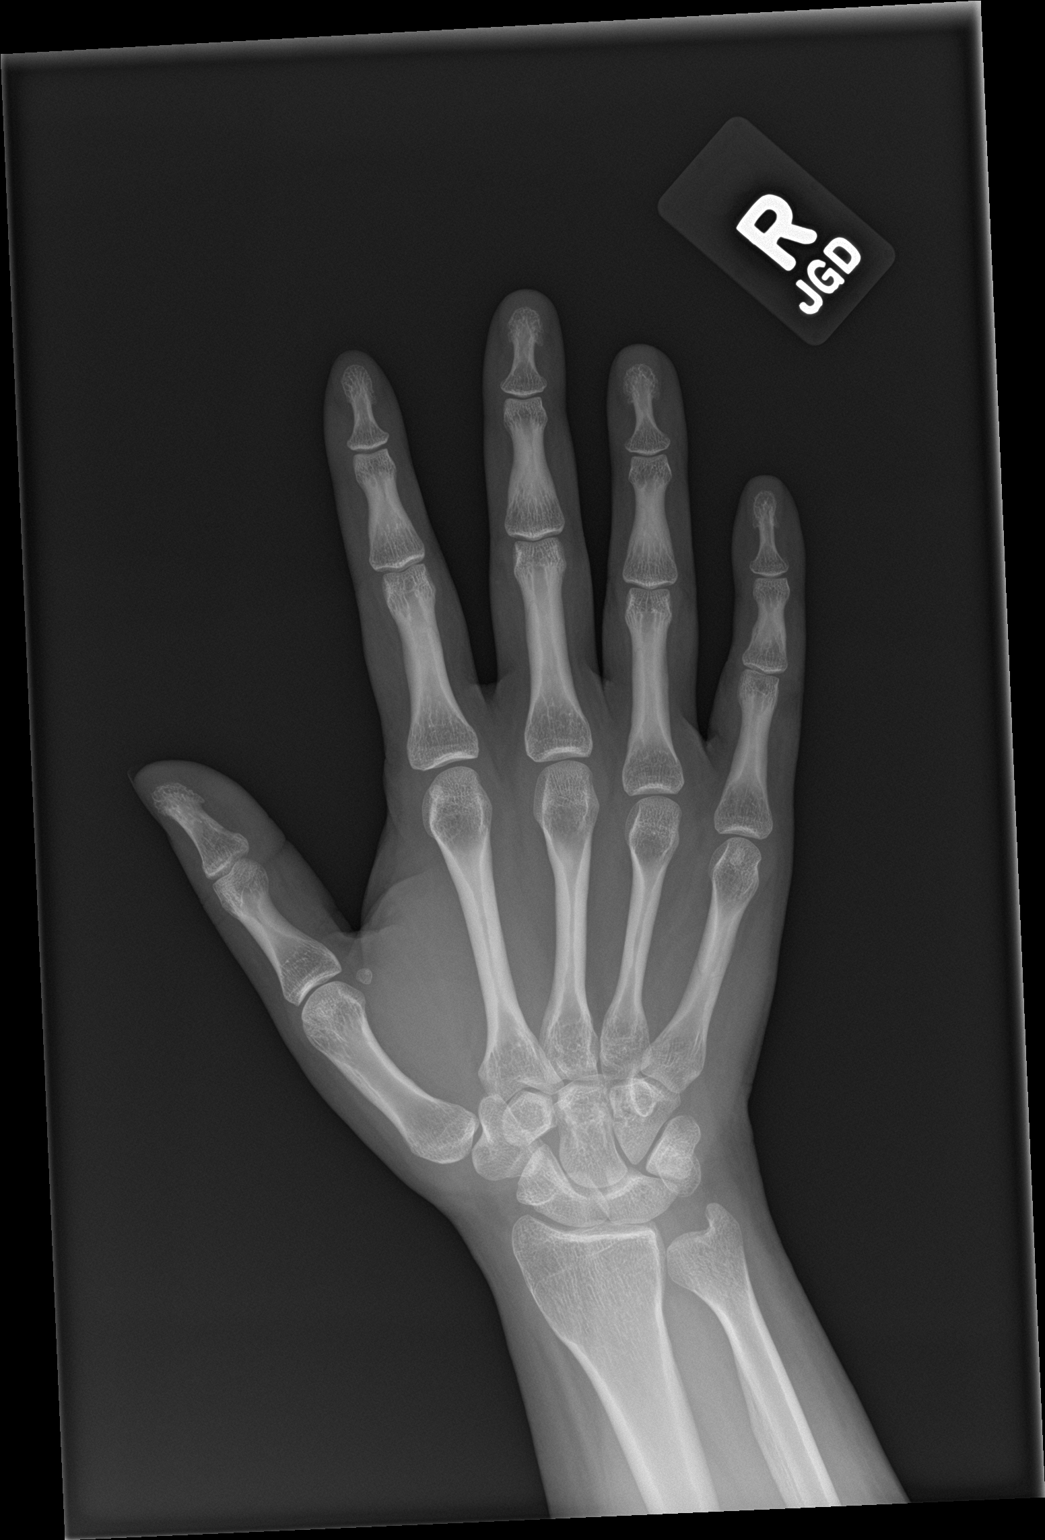

[hand obl]
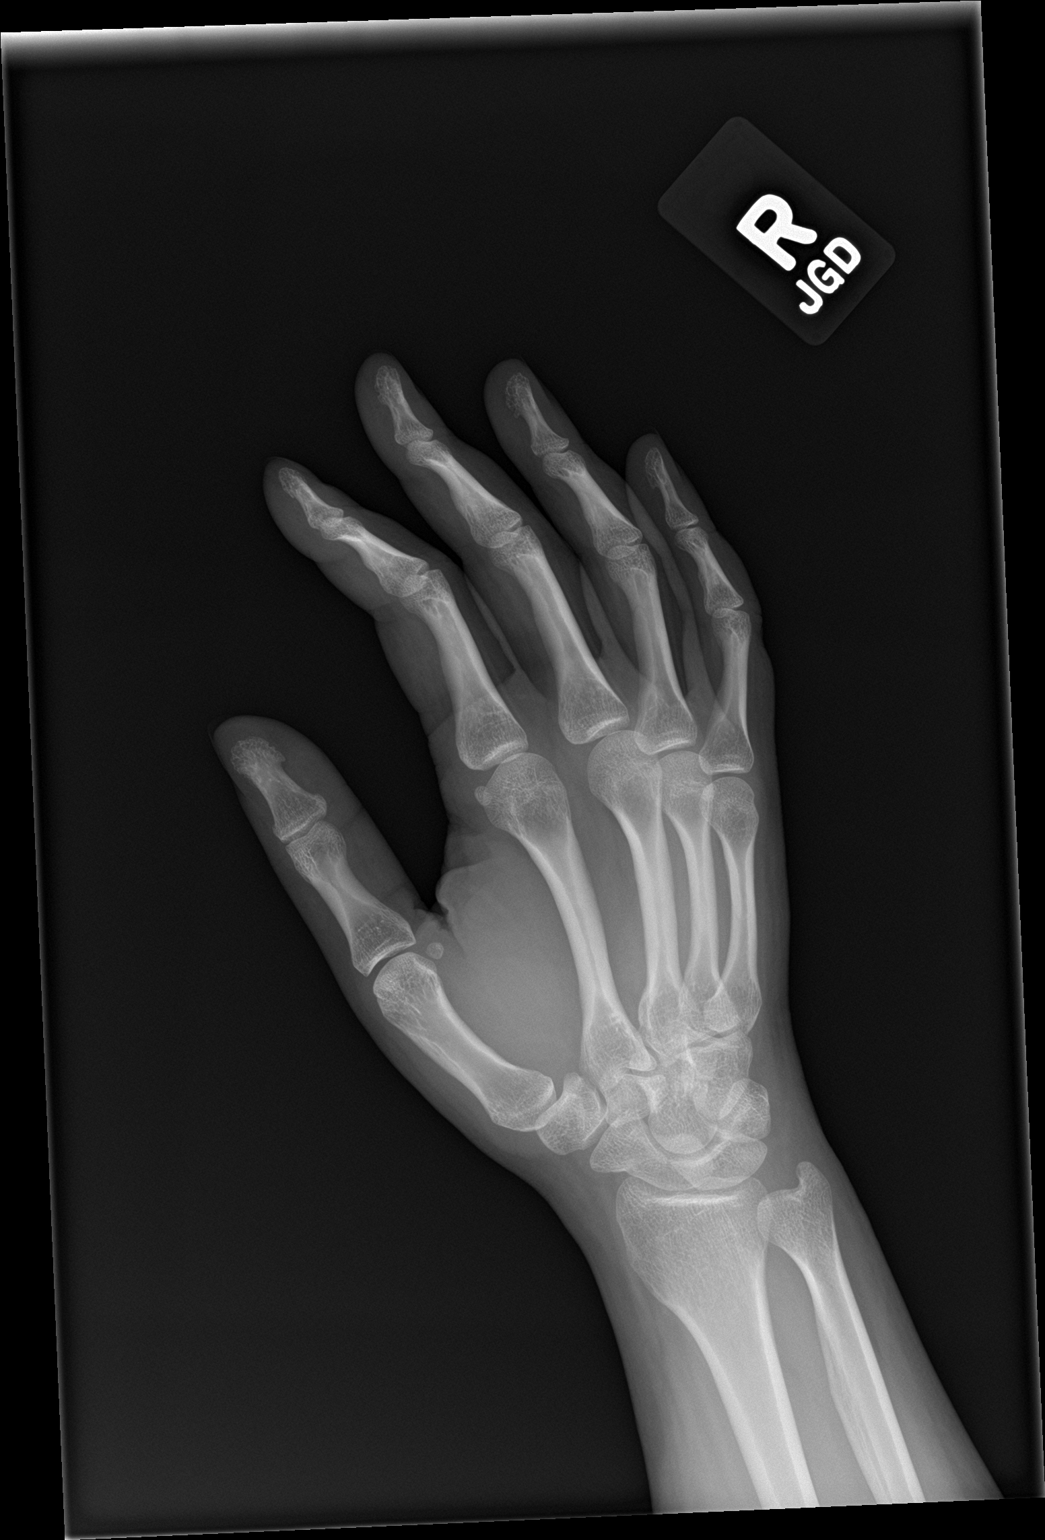

[hand lat]
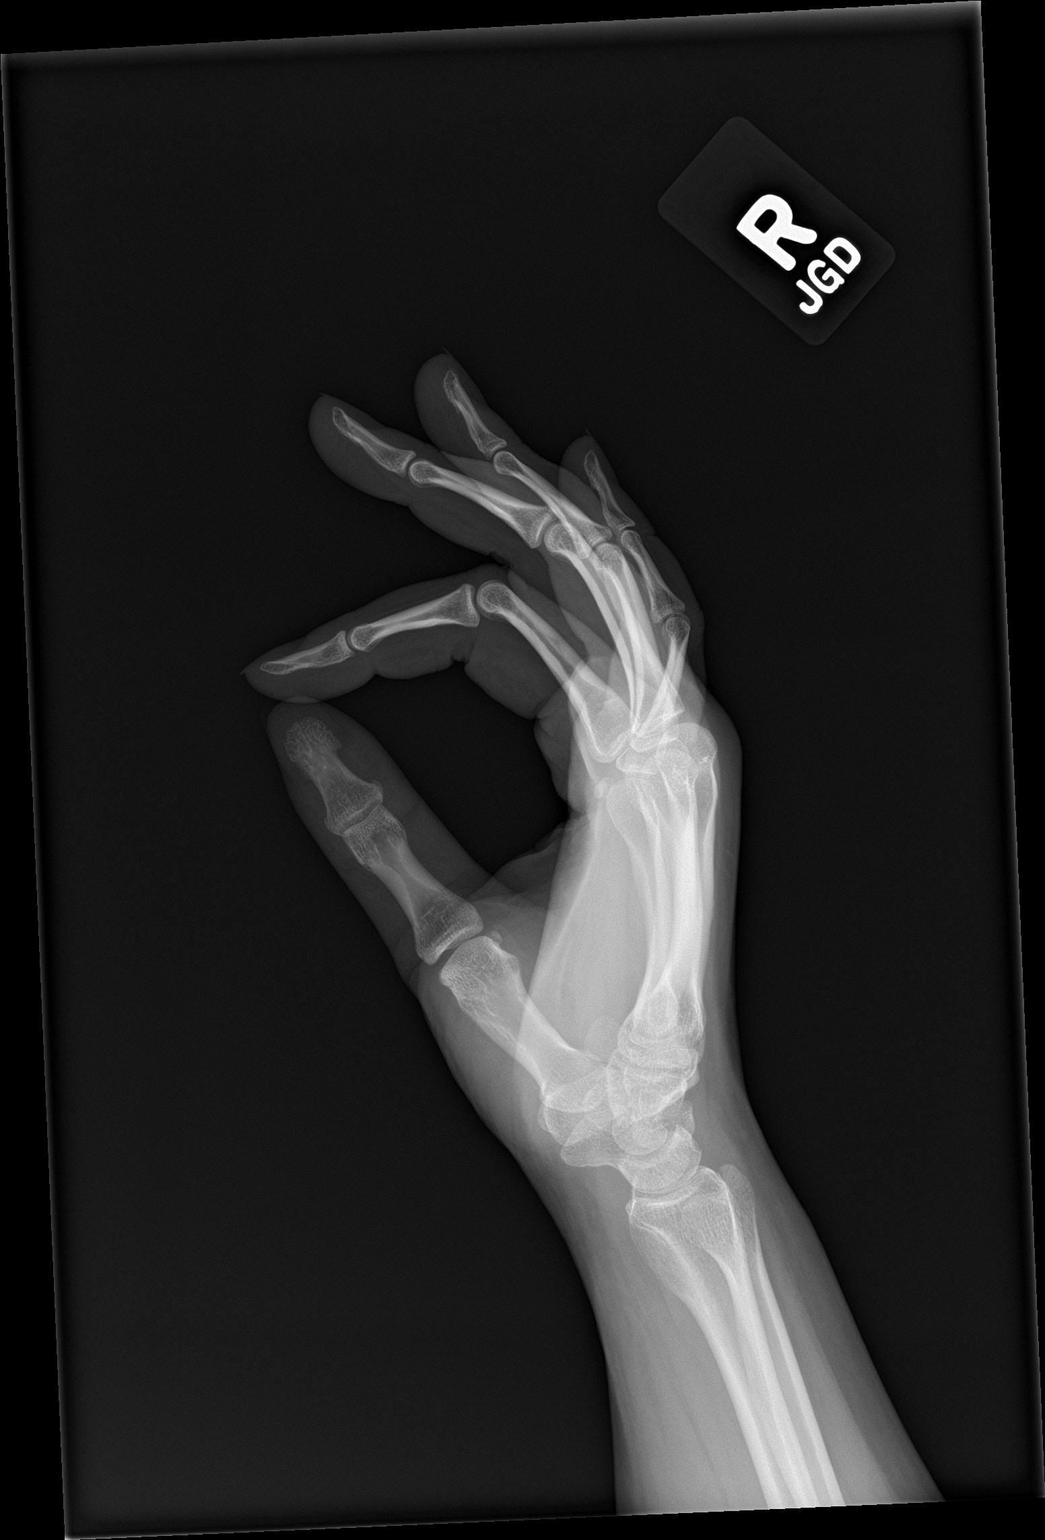

[3 of 3 positions shown; findings below may reference images not displayed]

FINDINGS: Acute nondisplaced fracture involving the midshaft of the fifth
metacarpal. No subluxation. No radiopaque foreign body.
IMPRESSION: Acute nondisplaced fracture involving the midshaft of the fifth
metacarpal.
